# Patient Record
Sex: Female | Born: 1980 | Race: Black or African American | Hispanic: No | Marital: Single | Smoking: Never smoker
Health system: Southern US, Community
[De-identification: ages and names within clinical notes are randomized; demographics above are authoritative.]

## PROBLEM LIST (undated history)

## (undated) DIAGNOSIS — F32A Depression, unspecified: Secondary | ICD-10-CM

## (undated) DIAGNOSIS — F329 Major depressive disorder, single episode, unspecified: Secondary | ICD-10-CM

## (undated) DIAGNOSIS — F419 Anxiety disorder, unspecified: Secondary | ICD-10-CM

## (undated) DIAGNOSIS — I10 Essential (primary) hypertension: Secondary | ICD-10-CM

---

## 2008-12-04 ENCOUNTER — Emergency Department (HOSPITAL_COMMUNITY): Admission: EM | Admit: 2008-12-04 | Discharge: 2008-12-04 | Payer: Self-pay | Admitting: Emergency Medicine

## 2008-12-11 ENCOUNTER — Emergency Department (HOSPITAL_COMMUNITY): Admission: EM | Admit: 2008-12-11 | Discharge: 2008-12-11 | Payer: Self-pay | Admitting: Emergency Medicine

## 2008-12-29 ENCOUNTER — Emergency Department (HOSPITAL_COMMUNITY): Admission: EM | Admit: 2008-12-29 | Discharge: 2008-12-29 | Payer: Self-pay | Admitting: Emergency Medicine

## 2009-01-10 ENCOUNTER — Emergency Department (HOSPITAL_COMMUNITY): Admission: EM | Admit: 2009-01-10 | Discharge: 2009-01-10 | Payer: Self-pay | Admitting: Emergency Medicine

## 2009-05-08 ENCOUNTER — Emergency Department (HOSPITAL_COMMUNITY): Admission: EM | Admit: 2009-05-08 | Discharge: 2009-05-08 | Payer: Self-pay | Admitting: Emergency Medicine

## 2011-01-05 ENCOUNTER — Emergency Department (HOSPITAL_COMMUNITY): Payer: Self-pay

## 2011-01-05 ENCOUNTER — Emergency Department (HOSPITAL_COMMUNITY)
Admission: EM | Admit: 2011-01-05 | Discharge: 2011-01-05 | Disposition: A | Discharge status: Home / Self Care | Payer: Self-pay | Attending: Emergency Medicine | Admitting: Emergency Medicine

## 2011-01-05 DIAGNOSIS — R079 Chest pain, unspecified: Secondary | ICD-10-CM | POA: Insufficient documentation

## 2011-01-05 DIAGNOSIS — M542 Cervicalgia: Secondary | ICD-10-CM | POA: Insufficient documentation

## 2011-01-05 DIAGNOSIS — F410 Panic disorder [episodic paroxysmal anxiety] without agoraphobia: Secondary | ICD-10-CM | POA: Insufficient documentation

## 2011-01-05 DIAGNOSIS — I10 Essential (primary) hypertension: Secondary | ICD-10-CM | POA: Insufficient documentation

## 2011-01-05 DIAGNOSIS — S139XXA Sprain of joints and ligaments of unspecified parts of neck, initial encounter: Secondary | ICD-10-CM | POA: Insufficient documentation

## 2011-01-05 DIAGNOSIS — R071 Chest pain on breathing: Secondary | ICD-10-CM | POA: Insufficient documentation

## 2011-01-11 ENCOUNTER — Emergency Department (HOSPITAL_COMMUNITY): Payer: Self-pay

## 2011-01-11 ENCOUNTER — Emergency Department (HOSPITAL_COMMUNITY)
Admission: EM | Admit: 2011-01-11 | Discharge: 2011-01-12 | Disposition: A | Discharge status: Home / Self Care | Payer: Self-pay | Attending: Emergency Medicine | Admitting: Emergency Medicine

## 2011-01-11 DIAGNOSIS — Z79899 Other long term (current) drug therapy: Secondary | ICD-10-CM | POA: Insufficient documentation

## 2011-01-11 DIAGNOSIS — R109 Unspecified abdominal pain: Secondary | ICD-10-CM | POA: Insufficient documentation

## 2011-01-11 DIAGNOSIS — Y9229 Other specified public building as the place of occurrence of the external cause: Secondary | ICD-10-CM | POA: Insufficient documentation

## 2011-01-11 DIAGNOSIS — R51 Headache: Secondary | ICD-10-CM | POA: Insufficient documentation

## 2011-01-11 DIAGNOSIS — S0990XA Unspecified injury of head, initial encounter: Secondary | ICD-10-CM | POA: Insufficient documentation

## 2011-01-11 DIAGNOSIS — F329 Major depressive disorder, single episode, unspecified: Secondary | ICD-10-CM | POA: Insufficient documentation

## 2011-01-11 DIAGNOSIS — R112 Nausea with vomiting, unspecified: Secondary | ICD-10-CM | POA: Insufficient documentation

## 2011-01-11 DIAGNOSIS — R079 Chest pain, unspecified: Secondary | ICD-10-CM | POA: Insufficient documentation

## 2011-01-11 DIAGNOSIS — Y998 Other external cause status: Secondary | ICD-10-CM | POA: Insufficient documentation

## 2011-01-11 DIAGNOSIS — I10 Essential (primary) hypertension: Secondary | ICD-10-CM | POA: Insufficient documentation

## 2011-01-11 DIAGNOSIS — F3289 Other specified depressive episodes: Secondary | ICD-10-CM | POA: Insufficient documentation

## 2011-01-11 DIAGNOSIS — R0989 Other specified symptoms and signs involving the circulatory and respiratory systems: Secondary | ICD-10-CM | POA: Insufficient documentation

## 2011-01-11 DIAGNOSIS — R0609 Other forms of dyspnea: Secondary | ICD-10-CM | POA: Insufficient documentation

## 2011-01-11 DIAGNOSIS — R42 Dizziness and giddiness: Secondary | ICD-10-CM | POA: Insufficient documentation

## 2011-01-11 DIAGNOSIS — F411 Generalized anxiety disorder: Secondary | ICD-10-CM | POA: Insufficient documentation

## 2011-01-12 LAB — BASIC METABOLIC PANEL
CO2: 27 mEq/L (ref 19–32)
Chloride: 105 mEq/L (ref 96–112)
Creatinine, Ser: 0.7 mg/dL (ref 0.4–1.2)
GFR calc Af Amer: >60 mL/min (ref >60)
Potassium: 3.3 mEq/L — ABNORMAL LOW (ref 3.5–5.1)
Sodium: 137 mEq/L (ref 135–145)

## 2011-01-12 LAB — CBC
Hemoglobin: 11.6 g/dL — ABNORMAL LOW (ref 12.0–15.0)
Platelets: 343 10*3/uL (ref 150–400)
RBC: 3.85 MIL/uL — ABNORMAL LOW (ref 3.87–5.11)
WBC: 7.9 10*3/uL (ref 4.0–10.5)

## 2011-01-12 LAB — DIFFERENTIAL
Basophils Absolute: 0.1 10*3/uL (ref 0.0–0.1)
Basophils Relative: 1 % (ref 0–1)
Eosinophils Absolute: 0.9 10*3/uL — ABNORMAL HIGH (ref 0.0–0.7)
Monocytes Relative: 6 % (ref 3–12)
Neutro Abs: 4.3 10*3/uL (ref 1.7–7.7)
Neutrophils Relative %: 54 % (ref 43–77)

## 2011-01-12 MED ORDER — IOHEXOL 300 MG/ML  SOLN
120.0000 mL | Freq: Once | INTRAMUSCULAR | Status: AC | PRN
  Administered 2011-01-12: 120 mL via INTRAVENOUS

## 2011-04-16 ENCOUNTER — Encounter: Payer: Self-pay | Admitting: Emergency Medicine

## 2011-04-16 ENCOUNTER — Emergency Department (HOSPITAL_COMMUNITY): Payer: Self-pay

## 2011-04-16 ENCOUNTER — Other Ambulatory Visit: Payer: Self-pay

## 2011-04-16 ENCOUNTER — Emergency Department (HOSPITAL_COMMUNITY)
Admission: EM | Admit: 2011-04-16 | Discharge: 2011-04-16 | Disposition: A | Discharge status: Home / Self Care | Payer: Self-pay | Attending: Emergency Medicine | Admitting: Emergency Medicine

## 2011-04-16 DIAGNOSIS — R42 Dizziness and giddiness: Secondary | ICD-10-CM | POA: Insufficient documentation

## 2011-04-16 DIAGNOSIS — R079 Chest pain, unspecified: Secondary | ICD-10-CM | POA: Insufficient documentation

## 2011-04-16 DIAGNOSIS — R0602 Shortness of breath: Secondary | ICD-10-CM | POA: Insufficient documentation

## 2011-04-16 DIAGNOSIS — N390 Urinary tract infection, site not specified: Secondary | ICD-10-CM | POA: Insufficient documentation

## 2011-04-16 DIAGNOSIS — R112 Nausea with vomiting, unspecified: Secondary | ICD-10-CM | POA: Insufficient documentation

## 2011-04-16 DIAGNOSIS — F411 Generalized anxiety disorder: Secondary | ICD-10-CM | POA: Insufficient documentation

## 2011-04-16 DIAGNOSIS — R109 Unspecified abdominal pain: Secondary | ICD-10-CM | POA: Insufficient documentation

## 2011-04-16 HISTORY — DX: Essential (primary) hypertension: I10

## 2011-04-16 LAB — DIFFERENTIAL
Eosinophils Absolute: 0.2 10*3/uL (ref 0.0–0.7)
Eosinophils Relative: 2 % (ref 0–5)
Lymphocytes Relative: 25 % (ref 12–46)
Lymphs Abs: 2.3 10*3/uL (ref 0.7–4.0)
Monocytes Relative: 6 % (ref 3–12)

## 2011-04-16 LAB — CBC
Hemoglobin: 13 g/dL (ref 12.0–15.0)
MCH: 30.1 pg (ref 26.0–34.0)
MCV: 91.7 fL (ref 78.0–100.0)
Platelets: 394 10*3/uL (ref 150–400)
RBC: 4.32 MIL/uL (ref 3.87–5.11)
WBC: 9 10*3/uL (ref 4.0–10.5)

## 2011-04-16 LAB — URINE MICROSCOPIC-ADD ON

## 2011-04-16 LAB — BASIC METABOLIC PANEL
BUN: 10 mg/dL (ref 6–23)
CO2: 27 mEq/L (ref 19–32)
Calcium: 9.6 mg/dL (ref 8.4–10.5)
Glucose, Bld: 86 mg/dL (ref 70–99)
Potassium: 3.4 mEq/L — ABNORMAL LOW (ref 3.5–5.1)
Sodium: 138 mEq/L (ref 135–145)

## 2011-04-16 LAB — URINALYSIS, ROUTINE W REFLEX MICROSCOPIC
Bilirubin Urine: NEGATIVE
Hgb urine dipstick: NEGATIVE
Ketones, ur: NEGATIVE mg/dL
Specific Gravity, Urine: >1.03 — ABNORMAL HIGH (ref 1.005–1.030)
Urobilinogen, UA: 0.2 mg/dL (ref 0.0–1.0)
pH: 6 (ref 5.0–8.0)

## 2011-04-16 MED ORDER — HYDROCODONE-ACETAMINOPHEN 5-325 MG PO TABS: 1.0000 | ORAL_TABLET | ORAL | Status: AC | PRN

## 2011-04-16 MED ORDER — CEPHALEXIN 500 MG PO CAPS: 500.0000 mg | ORAL_CAPSULE | Freq: Four times a day (QID) | ORAL | Status: AC

## 2011-04-16 MED ORDER — HYDROMORPHONE HCL 1 MG/ML IJ SOLN
1.0000 mg | Freq: Once | INTRAMUSCULAR | Status: AC
  Administered 2011-04-16: 1 mg via INTRAVENOUS
  Filled 2011-04-16: qty 1

## 2011-04-16 MED ORDER — IOHEXOL 300 MG/ML  SOLN
100.0000 mL | Freq: Once | INTRAMUSCULAR | Status: AC | PRN
  Administered 2011-04-16: 100 mL via INTRAVENOUS

## 2011-04-16 MED ORDER — SODIUM CHLORIDE 0.9 % IV SOLN
INTRAVENOUS | Status: DC
  Administered 2011-04-16: 15:00:00 via INTRAVENOUS

## 2011-04-16 MED ORDER — CEFTRIAXONE SODIUM 1 G IJ SOLR: 1.0000 g | Freq: Once | INTRAMUSCULAR | Status: DC

## 2011-04-16 MED ORDER — HYDROCODONE-ACETAMINOPHEN 5-325 MG PO TABS
1.0000 | ORAL_TABLET | Freq: Once | ORAL | Status: AC
  Administered 2011-04-16: 1 via ORAL
  Filled 2011-04-16: qty 1

## 2011-04-16 MED ORDER — CEFTRIAXONE SODIUM 1 G IJ SOLR
1.0000 g | Freq: Once | INTRAMUSCULAR | Status: AC
  Administered 2011-04-16: 1 g via INTRAVENOUS
  Filled 2011-04-16: qty 1

## 2011-04-16 MED ORDER — ONDANSETRON 4 MG PO TBDP
4.0000 mg | ORAL_TABLET | Freq: Once | ORAL | Status: AC
  Administered 2011-04-16: 4 mg via ORAL
  Filled 2011-04-16: qty 1

## 2011-04-16 NOTE — ED Notes (Signed)
Pt waiting to be reeval and disposition 

## 2011-04-16 NOTE — ED Provider Notes (Signed)
History    Scribed for Cassandra Jakes, MD, the patient was seen in room APA19/[facility]19. This chart was scribed by Clarita Crane. This patient's care was started at 1:49 PM.  Chief Complaint  Patient presents with  . Abdominal Pain  . Chest Pain  . Nausea  . Emesis   HPI Patient is a 30 year old female c/o waxing and waning right lower quadrant abdominal pain described as "painful" onset 1.5 weeks ago but worse today with associated nausea, vomiting, lightheadedness. Rates current abdominal pain an 8/10. States vomiting began yesterday morning and reports 2 episodes of vomiting today. Additionally, patient reports having intermittent  chest pains with SOB onset 1.5 weeks ago and persistent since. States chest pain will resolve on its own and notes most episodes occur when she becomes anxious. Denies diarrhea, fever, dysuria, rash, back pain.  LNMP- 3 days ago.   PAST MEDICAL HISTORY:  Past Medical History  Diagnosis Date  . Hypertension     PAST SURGICAL HISTORY:  History reviewed. No pertinent past surgical history.  MEDICATIONS:  Previous Medications   ACETAMINOPHEN (TYLENOL) 500 MG TABLET    Take 1,000 mg by mouth every 6 (six) hours as needed. For pain    FLUOXETINE (PROZAC) 20 MG TABLET    Take 20 mg by mouth daily.     GABAPENTIN (NEURONTIN) 300 MG CAPSULE    Take 300 mg by mouth daily.    IBUPROFEN (ADVIL,MOTRIN) 800 MG TABLET    Take 800 mg by mouth every 8 (eight) hours as needed. For migraine    MULTIVITAMIN (THERAGRAN) PER TABLET    Take 1 tablet by mouth daily.     NORGESTIMATE-ETHINYL ESTRADIOL (ORTHO-CYCLEN,SPRINTEC,PREVIFEM) 0.25-35 MG-MCG TABLET    Take 1 tablet by mouth daily.     SPIRONOLACTONE (ALDACTONE) 25 MG TABLET    Take 25 mg by mouth daily.       ALLERGIES:  Allergies as of 04/16/2011 - Review Complete 04/16/2011  Allergen Reaction Noted  . Promethazine  04/16/2011  . Reglan  04/16/2011     FAMILY HISTORY:  No family history on file.   SOCIAL  HISTORY: History   Social History  . Marital Status: Single    Spouse Name: N/A    Number of Children: N/A  . Years of Education: N/A   Social History Main Topics  . Smoking status: Never Smoker   . Smokeless tobacco: None  . Alcohol Use: No  . Drug Use: No  . Sexually Active: Yes    Birth Control/ Protection: Pill   Other Topics Concern  . None   Social History Narrative  . None      OB History    Grav Para Term Preterm Abortions TAB SAB Ect Mult Living   0               Review of Systems  Constitutional: Negative for fever and chills.  HENT: Negative for rhinorrhea.   Eyes: Negative for pain.  Respiratory: Positive for shortness of breath. Negative for cough.   Cardiovascular: Positive for chest pain.  Gastrointestinal: Positive for nausea, vomiting and abdominal pain. Negative for diarrhea.  Genitourinary: Negative for dysuria.  Musculoskeletal: Negative for back pain.  Skin: Negative for rash.  Neurological: Positive for light-headedness. Negative for weakness and headaches.  Psychiatric/Behavioral: The patient is nervous/anxious.     Physical Exam  BP 127/65  Pulse 74  Temp(Src) 98.9 F (37.2 C) (Oral)  Resp 18  Ht 5\' 2"  (1.575 m)  Wt 226 lb (102.513 kg)  BMI 41.34 kg/m2  SpO2 100%  LMP 04/11/2011  Physical Exam  Nursing note and vitals reviewed. Constitutional: She is oriented to person, place, and time. Vital signs are normal. She appears well-developed and well-nourished. No distress.       Obese  HENT:  Head: Normocephalic and atraumatic.  Eyes: Conjunctivae are normal. Pupils are equal, round, and reactive to light.  Neck: Neck supple.  Cardiovascular: Normal rate and regular rhythm.  Exam reveals no gallop and no friction rub.   No murmur heard. Pulmonary/Chest: Effort normal and breath sounds normal. She has no wheezes.  Abdominal: Soft. Bowel sounds are normal. She exhibits no distension. There is no tenderness.  Musculoskeletal:  Normal range of motion. She exhibits no edema.  Neurological: She is alert and oriented to person, place, and time. No sensory deficit.  Skin: Skin is warm and dry.  Psychiatric: She has a normal mood and affect. Her behavior is normal.    ED Course  Procedures  OTHER DATA REVIEWED: Nursing notes, vital signs, and past medical records reviewed.   DIAGNOSTIC STUDIES:    LABS / RADIOLOGY: Results for orders placed during the hospital encounter of 04/16/11  CBC      Component Value Range   WBC 9.0  4.0 - 10.5 (K/uL)   RBC 4.32  3.87 - 5.11 (MIL/uL)   Hemoglobin 13.0  12.0 - 15.0 (g/dL)   HCT 16.1  09.6 - 04.5 (%)   MCV 91.7  78.0 - 100.0 (fL)   MCH 30.1  26.0 - 34.0 (pg)   MCHC 32.8  30.0 - 36.0 (g/dL)   RDW 40.9  81.1 - 91.4 (%)   Platelets 394  150 - 400 (K/uL)  DIFFERENTIAL      Component Value Range   Neutrophils Relative 67  43 - 77 (%)   Neutro Abs 6.0  1.7 - 7.7 (K/uL)   Lymphocytes Relative 25  12 - 46 (%)   Lymphs Abs 2.3  0.7 - 4.0 (K/uL)   Monocytes Relative 6  3 - 12 (%)   Monocytes Absolute 0.5  0.1 - 1.0 (K/uL)   Eosinophils Relative 2  0 - 5 (%)   Eosinophils Absolute 0.2  0.0 - 0.7 (K/uL)   Basophils Relative 1  0 - 1 (%)   Basophils Absolute 0.1  0.0 - 0.1 (K/uL)  BASIC METABOLIC PANEL      Component Value Range   Sodium 138  135 - 145 (mEq/L)   Potassium 3.4 (*) 3.5 - 5.1 (mEq/L)   Chloride 102  96 - 112 (mEq/L)   CO2 27  19 - 32 (mEq/L)   Glucose, Bld 86  70 - 99 (mg/dL)   BUN 10  6 - 23 (mg/dL)   Creatinine, Ser 7.82  0.50 - 1.10 (mg/dL)   Calcium 9.6  8.4 - 95.6 (mg/dL)   GFR calc non Af Amer >60  >60 (mL/min)   GFR calc Af Amer >60  >60 (mL/min)  PREGNANCY, URINE      Component Value Range   Preg Test, Ur NEGATIVE    URINALYSIS, ROUTINE W REFLEX MICROSCOPIC      Component Value Range   Color, Urine YELLOW  YELLOW    Appearance CLEAR  CLEAR    Specific Gravity, Urine >1.030 (*) 1.005 - 1.030    pH 6.0  5.0 - 8.0    Glucose, UA NEGATIVE   NEGATIVE (mg/dL)   Hgb urine dipstick NEGATIVE  NEGATIVE    Bilirubin Urine NEGATIVE  NEGATIVE    Ketones, ur NEGATIVE  NEGATIVE (mg/dL)   Protein, ur NEGATIVE  NEGATIVE (mg/dL)   Urobilinogen, UA 0.2  0.0 - 1.0 (mg/dL)   Nitrite NEGATIVE  NEGATIVE    Leukocytes, UA TRACE (*) NEGATIVE   URINE MICROSCOPIC-ADD ON      Component Value Range   Squamous Epithelial / LPF MANY (*) RARE    WBC, UA 11-20  <3 (WBC/hpf)   RBC / HPF 0-2  <3 (RBC/hpf)   Bacteria, UA MANY (*) RARE    Crystals CA OXALATE CRYSTALS (*) NEGATIVE      PROCEDURES:  ED COURSE / COORDINATION OF CARE:  Date: 04/16/2011  Rate: 73  Rhythm: normal sinus rhythm  QRS Axis: normal  Intervals: QT prolonged  ST/T Wave abnormalities: normal  Conduction Disutrbances:none  Narrative Interpretation:   Old EKG Reviewed: none available    MDM: Differential Diagnosis:  CT OF ABD WITHOUT SPECIFIC FINDINGS. BUT UA ABNORMAL PERHAPS SYMPTOMS RELATED TO UTI BUT PAIN MOSTLY RLQ. LABS OTHERWISE NORMAL.   I personally performed the services described in this documentation, which was scribed in my presence. The recorded information has been reviewed and considered. Cassandra Jakes, MD  IMPRESSION: Diagnoses that have been ruled out:  Diagnoses that are still under consideration:  Final diagnoses:     PLAN: DISCHARGE The patient is to return the emergency department if there is any worsening of symptoms. I have reviewed the discharge instructions with the patient/family   CONDITION ON DISCHARGE: STABLE   MEDICATIONS GIVEN IN THE E.D.  Medications  gabapentin (NEURONTIN) 300 MG capsule (not administered)  FLUoxetine (PROZAC) 20 MG tablet (not administered)  norgestimate-ethinyl estradiol (ORTHO-CYCLEN,SPRINTEC,PREVIFEM) 0.25-35 MG-MCG tablet (not administered)  multivitamin (THERAGRAN) per tablet (not administered)  spironolactone (ALDACTONE) 25 MG tablet (not administered)  acetaminophen (TYLENOL) 500 MG tablet  (not administered)  ibuprofen (ADVIL,MOTRIN) 800 MG tablet (not administered)     DISCHARGE MEDICATIONS: New Prescriptions   No medications on file       Cassandra Jakes, MD 04/16/11 [phone]

## 2011-04-16 NOTE — ED Notes (Signed)
Pt c/o intermittent abd pain/n/v/chest pressure x 1.5 weeks with some lightheadedness/dissiness/sob at times. Pt c/o "feeling anxious" with these symptoms. nad noted.

## 2011-04-16 NOTE — ED Notes (Signed)
Pt req more pain meds. States her stomach has started hurting again. edp aware

## 2011-04-21 ENCOUNTER — Emergency Department (HOSPITAL_COMMUNITY)
Admission: EM | Admit: 2011-04-21 | Discharge: 2011-04-22 | Disposition: A | Discharge status: Home / Self Care | Payer: Self-pay | Attending: Emergency Medicine | Admitting: Emergency Medicine

## 2011-04-21 ENCOUNTER — Encounter (HOSPITAL_COMMUNITY): Payer: Self-pay | Admitting: *Deleted

## 2011-04-21 DIAGNOSIS — N730 Acute parametritis and pelvic cellulitis: Secondary | ICD-10-CM | POA: Insufficient documentation

## 2011-04-21 DIAGNOSIS — N760 Acute vaginitis: Secondary | ICD-10-CM | POA: Insufficient documentation

## 2011-04-21 DIAGNOSIS — I10 Essential (primary) hypertension: Secondary | ICD-10-CM | POA: Insufficient documentation

## 2011-04-21 DIAGNOSIS — B9689 Other specified bacterial agents as the cause of diseases classified elsewhere: Secondary | ICD-10-CM | POA: Insufficient documentation

## 2011-04-21 DIAGNOSIS — N390 Urinary tract infection, site not specified: Secondary | ICD-10-CM | POA: Insufficient documentation

## 2011-04-21 DIAGNOSIS — A499 Bacterial infection, unspecified: Secondary | ICD-10-CM | POA: Insufficient documentation

## 2011-04-21 LAB — CBC
Platelets: 353 10*3/uL (ref 150–400)
RBC: 4.38 MIL/uL (ref 3.87–5.11)
RDW: 13.3 % (ref 11.5–15.5)
WBC: 8.6 10*3/uL (ref 4.0–10.5)

## 2011-04-21 LAB — URINALYSIS, ROUTINE W REFLEX MICROSCOPIC
Bilirubin Urine: NEGATIVE
Glucose, UA: NEGATIVE mg/dL
Hgb urine dipstick: NEGATIVE
Ketones, ur: NEGATIVE mg/dL
Protein, ur: NEGATIVE mg/dL

## 2011-04-21 LAB — COMPREHENSIVE METABOLIC PANEL
ALT: 15 U/L (ref 0–35)
AST: 18 U/L (ref 0–37)
Alkaline Phosphatase: 53 U/L (ref 39–117)
CO2: 25 mEq/L (ref 19–32)
Chloride: 99 mEq/L (ref 96–112)
GFR calc non Af Amer: >60 mL/min (ref >60)
Potassium: 3.8 mEq/L (ref 3.5–5.1)
Sodium: 137 mEq/L (ref 135–145)
Total Bilirubin: 0.3 mg/dL (ref 0.3–1.2)

## 2011-04-21 LAB — URINE MICROSCOPIC-ADD ON

## 2011-04-21 LAB — DIFFERENTIAL
Basophils Absolute: 0.1 10*3/uL (ref 0.0–0.1)
Lymphocytes Relative: 30 % (ref 12–46)
Lymphs Abs: 2.6 10*3/uL (ref 0.7–4.0)
Neutro Abs: 4.6 10*3/uL (ref 1.7–7.7)

## 2011-04-21 LAB — WET PREP, GENITAL
Trich, Wet Prep: NONE SEEN
Yeast Wet Prep HPF POC: NONE SEEN

## 2011-04-21 MED ORDER — DOXYCYCLINE HYCLATE 100 MG PO TABS
100.0000 mg | ORAL_TABLET | Freq: Once | ORAL | Status: AC
  Administered 2011-04-21: 100 mg via ORAL
  Filled 2011-04-21: qty 1

## 2011-04-21 MED ORDER — METRONIDAZOLE 500 MG PO TABS: 500.0000 mg | ORAL_TABLET | Freq: Three times a day (TID) | ORAL | Status: DC

## 2011-04-21 MED ORDER — OXYCODONE-ACETAMINOPHEN 5-325 MG PO TABS: 1.0000 | ORAL_TABLET | ORAL | Status: AC | PRN

## 2011-04-21 MED ORDER — SODIUM CHLORIDE 0.9 % IV SOLN
999.0000 mL | INTRAVENOUS | Status: DC
  Administered 2011-04-21: 999 mL via INTRAVENOUS

## 2011-04-21 MED ORDER — DOXYCYCLINE HYCLATE 100 MG PO CAPS: 100.0000 mg | ORAL_CAPSULE | Freq: Two times a day (BID) | ORAL | Status: AC

## 2011-04-21 MED ORDER — OXYCODONE-ACETAMINOPHEN 5-325 MG PO TABS
1.0000 | ORAL_TABLET | Freq: Once | ORAL | Status: AC
  Administered 2011-04-21: 1 via ORAL
  Filled 2011-04-21: qty 1

## 2011-04-21 MED ORDER — METRONIDAZOLE 500 MG PO TABS
500.0000 mg | ORAL_TABLET | Freq: Once | ORAL | Status: AC
  Administered 2011-04-21: 500 mg via ORAL
  Filled 2011-04-21: qty 1

## 2011-04-21 MED ORDER — METRONIDAZOLE 500 MG PO TABS: 500.0000 mg | ORAL_TABLET | Freq: Two times a day (BID) | ORAL | Status: AC

## 2011-04-21 MED ORDER — ONDANSETRON HCL 4 MG/2ML IJ SOLN
4.0000 mg | Freq: Once | INTRAMUSCULAR | Status: AC
  Administered 2011-04-21: 4 mg via INTRAVENOUS
  Filled 2011-04-21: qty 2

## 2011-04-21 MED ORDER — CEFTRIAXONE SODIUM 250 MG IJ SOLR
250.0000 mg | INTRAMUSCULAR | Status: DC
  Administered 2011-04-21: 250 mg via INTRAMUSCULAR
  Filled 2011-04-21: qty 250

## 2011-04-21 MED ORDER — HYDROMORPHONE HCL 1 MG/ML IJ SOLN
1.0000 mg | Freq: Once | INTRAMUSCULAR | Status: AC
  Administered 2011-04-21: 1 mg via INTRAVENOUS
  Filled 2011-04-21: qty 1

## 2011-04-21 NOTE — ED Provider Notes (Signed)
History     CSN: [id number] Arrival date & time: 04/21/2011  8:06 PM  Chief Complaint  Patient presents with  . Abdominal Pain   Patient is a 30 y.o. female presenting with abdominal pain. The history is provided by the patient.  Abdominal Pain The primary symptoms of the illness include abdominal pain, nausea and vaginal bleeding. The primary symptoms of the illness do not include vomiting, diarrhea or vaginal discharge. Episode onset: She was seen here six days ago, diagnosed wtih a UTI and sent home on Keflex and Norco which have not helped. The onset of the illness was gradual. The problem has been gradually worsening (Pain is 8/10 currently, but has been as bad as 10/10).  The patient has not had a change in bowel habit. Additional symptoms associated with the illness include urgency and frequency. Symptoms associated with the illness do not include anorexia or diaphoresis. Associated symptoms comments: Nothing makes her pain better, nothing makes it worse. She denies vaginal discharge..    Past Medical History  Diagnosis Date  . Hypertension     History reviewed. No pertinent past surgical history.  History reviewed. No pertinent family history.  History  Substance Use Topics  . Smoking status: Never Smoker   . Smokeless tobacco: Not on file  . Alcohol Use: No    OB History    Grav Para Term Preterm Abortions TAB SAB Ect Mult Living   0               Review of Systems  Constitutional: Negative for diaphoresis.  Gastrointestinal: Positive for nausea and abdominal pain. Negative for vomiting, diarrhea and anorexia.  Genitourinary: Positive for urgency, frequency and vaginal bleeding. Negative for vaginal discharge.  All other systems reviewed and are negative.    Physical Exam  BP 134/77  Pulse 98  Temp(Src) 98.8 F (37.1 C) (Oral)  Resp 18  Ht 5\' 2"  (1.575 m)  Wt 227 lb (102.967 kg)  BMI 41.52 kg/m2  SpO2 100%  LMP 04/11/2011  Physical Exam  Nursing note  and vitals reviewed. Constitutional: She is oriented to person, place, and time. She appears well-developed and well-nourished. No distress.  HENT:  Head: Normocephalic and atraumatic.  Right Ear: External ear normal.  Left Ear: External ear normal.  Nose: Nose normal.  Mouth/Throat: Oropharynx is clear and moist.  Eyes: Conjunctivae are normal. Pupils are equal, round, and reactive to light. No scleral icterus.  Neck: Normal range of motion. Neck supple. No JVD present.  Cardiovascular: Normal rate, regular rhythm and normal heart sounds.   No murmur heard. Pulmonary/Chest: Effort normal and breath sounds normal. She has no wheezes. She has no rales.  Abdominal: Soft. Bowel sounds are normal. There is tenderness in the right lower quadrant and suprapubic area. There is CVA tenderness. There is no rebound and no guarding.  Genitourinary:       Normal external genitalia. Moderate white vaginal discharge. Marked tenderness diffusely, with positive cervical motion tenderness.  Musculoskeletal: Normal range of motion. She exhibits edema.       Arms: Lymphadenopathy:    She has no cervical adenopathy.  Neurological: She is alert and oriented to person, place, and time. She has normal reflexes. Coordination normal.  Skin: Skin is warm and dry. No rash noted.  Psychiatric: She has a normal mood and affect.    ED Course  Procedures  MDM Persistent lower abdominal pain. CT on last visit was negative. Will need to repeat lab workup and  also do pelvic exam.  Pelvic exam most consistent with PID - will treat accordingly. Wet prep shows clue cells - will also treat for bacterial vaginosis.  Labs reviewed.  Results for orders placed during the hospital encounter of 04/21/11  CBC      Component Value Range   WBC 8.6  4.0 - 10.5 (K/uL)   RBC 4.38  3.87 - 5.11 (MIL/uL)   Hemoglobin 13.1  12.0 - 15.0 (g/dL)   HCT 16.1  09.6 - 04.5 (%)   MCV 91.1  78.0 - 100.0 (fL)   MCH 29.9  26.0 - 34.0 (pg)    MCHC 32.8  30.0 - 36.0 (g/dL)   RDW 40.9  81.1 - 91.4 (%)   Platelets 353  150 - 400 (K/uL)  DIFFERENTIAL      Component Value Range   Neutrophils Relative 54  43 - 77 (%)   Neutro Abs 4.6  1.7 - 7.7 (K/uL)   Lymphocytes Relative 30  12 - 46 (%)   Lymphs Abs 2.6  0.7 - 4.0 (K/uL)   Monocytes Relative 7  3 - 12 (%)   Monocytes Absolute 0.6  0.1 - 1.0 (K/uL)   Eosinophils Relative 9 (*) 0 - 5 (%)   Eosinophils Absolute 0.8 (*) 0.0 - 0.7 (K/uL)   Basophils Relative 1  0 - 1 (%)   Basophils Absolute 0.1  0.0 - 0.1 (K/uL)  COMPREHENSIVE METABOLIC PANEL      Component Value Range   Sodium 137  135 - 145 (mEq/L)   Potassium 3.8  3.5 - 5.1 (mEq/L)   Chloride 99  96 - 112 (mEq/L)   CO2 25  19 - 32 (mEq/L)   Glucose, Bld 97  70 - 99 (mg/dL)   BUN 7  6 - 23 (mg/dL)   Creatinine, Ser 7.82  0.50 - 1.10 (mg/dL)   Calcium 9.4  8.4 - 95.6 (mg/dL)   Total Protein 7.6  6.0 - 8.3 (g/dL)   Albumin 3.7  3.5 - 5.2 (g/dL)   AST 18  0 - 37 (U/L)   ALT 15  0 - 35 (U/L)   Alkaline Phosphatase 53  39 - 117 (U/L)   Total Bilirubin 0.3  0.3 - 1.2 (mg/dL)   GFR calc non Af Amer >60  >60 (mL/min)   GFR calc Af Amer >60  >60 (mL/min)  LIPASE, BLOOD      Component Value Range   Lipase 18  11 - 59 (U/L)  URINALYSIS, ROUTINE W REFLEX MICROSCOPIC      Component Value Range   Color, Urine YELLOW  YELLOW    Appearance CLEAR  CLEAR    Specific Gravity, Urine 1.015  1.005 - 1.030    pH 7.0  5.0 - 8.0    Glucose, UA NEGATIVE  NEGATIVE (mg/dL)   Hgb urine dipstick NEGATIVE  NEGATIVE    Bilirubin Urine NEGATIVE  NEGATIVE    Ketones, ur NEGATIVE  NEGATIVE (mg/dL)   Protein, ur NEGATIVE  NEGATIVE (mg/dL)   Urobilinogen, UA 0.2  0.0 - 1.0 (mg/dL)   Nitrite NEGATIVE  NEGATIVE    Leukocytes, UA TRACE (*) NEGATIVE   WET PREP, GENITAL      Component Value Range   Yeast, Wet Prep NONE SEEN  NONE SEEN    Trich, Wet Prep NONE SEEN  NONE SEEN    Clue Cells, Wet Prep MANY (*) NONE SEEN    WBC, Wet Prep HPF POC  MANY (*) NONE SEEN  URINE MICROSCOPIC-ADD ON      Component Value Range   Squamous Epithelial / LPF FEW (*) RARE    WBC, UA 21-50  <3 (WBC/hpf)   Bacteria, UA MANY (*) RARE   POCT PREGNANCY, URINE      Component Value Range   Preg Test, Ur NEGATIVE     Ct Abdomen Pelvis W Contrast  04/16/2011  *RADIOLOGY REPORT*  Clinical Data: Chest abdominal pain with nausea and emesis for 10 days, worse today.  CT ABDOMEN AND PELVIS WITH CONTRAST  Technique:  Multidetector CT imaging of the abdomen and pelvis was performed following the standard protocol during bolus administration of intravenous contrast.  Contrast: 100 ml Omnipaque-300 intravenously.  Comparison: Prior examination 01/12/2011.  Findings: The lung bases are clear.  There is no pleural effusion. A small hiatal hernia is again noted.  The bowel gas pattern is normal.  The appendix appears normal.  There are no inflammatory changes.  A small right adrenal nodule appears unchanged.  The left adrenal gland appears normal.  The liver, spleen, gallbladder, pancreas and biliary system appear normal.  The kidneys appear normal.  The uterus, ovaries and urinary bladder appear normal.  There are no acute osseous findings.  IMPRESSION: Stable examination - no acute abdominal pelvic findings demonstrated.  Stable small right adrenal adenoma and hiatal hernia.  Original Report Authenticated By: Gerrianne Scale, M.D.      Dione Booze, MD 04/21/11 [phone]

## 2011-04-21 NOTE — ED Notes (Signed)
Pt c/o abdominal pain x 2 weeks. Pt was seen in ED last Monday and told she had a UTI. Pt states that it has gotten worse. Pt c/o urinary frequency and vomiting also. States that she finishes the antibiotics tonight.

## 2011-04-23 LAB — GC/CHLAMYDIA PROBE AMP, GENITAL: GC Probe Amp, Genital: NEGATIVE

## 2011-04-24 ENCOUNTER — Encounter (HOSPITAL_COMMUNITY): Payer: Self-pay | Admitting: *Deleted

## 2011-04-24 ENCOUNTER — Emergency Department (HOSPITAL_COMMUNITY): Payer: Self-pay

## 2011-04-24 ENCOUNTER — Emergency Department (HOSPITAL_COMMUNITY)
Admission: EM | Admit: 2011-04-24 | Discharge: 2011-04-24 | Disposition: A | Discharge status: Home / Self Care | Payer: Self-pay | Attending: Emergency Medicine | Admitting: Emergency Medicine

## 2011-04-24 DIAGNOSIS — I10 Essential (primary) hypertension: Secondary | ICD-10-CM | POA: Insufficient documentation

## 2011-04-24 DIAGNOSIS — R509 Fever, unspecified: Secondary | ICD-10-CM | POA: Insufficient documentation

## 2011-04-24 DIAGNOSIS — R1084 Generalized abdominal pain: Secondary | ICD-10-CM

## 2011-04-24 DIAGNOSIS — R112 Nausea with vomiting, unspecified: Secondary | ICD-10-CM | POA: Insufficient documentation

## 2011-04-24 DIAGNOSIS — R109 Unspecified abdominal pain: Secondary | ICD-10-CM | POA: Insufficient documentation

## 2011-04-24 LAB — URINE CULTURE: Culture: NO GROWTH

## 2011-04-24 MED ORDER — ONDANSETRON 8 MG PO TBDP
8.0000 mg | ORAL_TABLET | Freq: Once | ORAL | Status: AC
  Administered 2011-04-24: 8 mg via ORAL
  Filled 2011-04-24: qty 1

## 2011-04-24 MED ORDER — OXYCODONE-ACETAMINOPHEN 5-325 MG PO TABS
2.0000 | ORAL_TABLET | Freq: Once | ORAL | Status: AC
  Administered 2011-04-24: 2 via ORAL
  Filled 2011-04-24 (×2): qty 1

## 2011-04-24 MED ORDER — ONDANSETRON HCL 4 MG PO TABS: 8.0000 mg | ORAL_TABLET | Freq: Four times a day (QID) | ORAL | Status: AC

## 2011-04-24 NOTE — ED Notes (Signed)
Pt transported to [facility] at this time. 

## 2011-04-24 NOTE — ED Notes (Signed)
Pt c/o sharp pain to vaginal area

## 2011-04-24 NOTE — ED Provider Notes (Signed)
History     CSN: [id number] Arrival date & time: 04/24/2011  6:45 AM  Chief Complaint  Patient presents with  . Abdominal Pain   Patient is a 30 y.o. female presenting with abdominal pain.  Abdominal Pain The primary symptoms of the illness include abdominal pain, fever, nausea and vomiting. The primary symptoms of the illness do not include diarrhea, dysuria, vaginal discharge or vaginal bleeding. The current episode started less than 1 hour ago. The onset of the illness was sudden. The problem has been gradually worsening.  The illness is associated with eating (movement). The patient states that she believes she is currently not pregnant.   Cassandra Rosario is a 30 y.o. female who presents to the Emergency Department complaining of abdominal pain.  Pt reports that she has had lower abd pain for past several weeks.  This morning, sat down on a bench and had worsening of the lower abd pain and radiated into her pelvic region.  No vag bleeding, no trauma.    PAST MEDICAL HISTORY:  Past Medical History  Diagnosis Date  . Hypertension      PAST SURGICAL HISTORY:  History reviewed. No pertinent past surgical history.   MEDICATIONS:  Previous Medications   ACETAMINOPHEN (TYLENOL) 500 MG TABLET    Take 1,000 mg by mouth every 6 (six) hours as needed. For pain    CEPHALEXIN (KEFLEX) 500 MG CAPSULE    Take 1 capsule (500 mg total) by mouth 4 (four) times daily.   DOXYCYCLINE (VIBRAMYCIN) 100 MG CAPSULE    Take 1 capsule (100 mg total) by mouth 2 (two) times daily.   FLUOXETINE (PROZAC) 20 MG TABLET    Take 20 mg by mouth daily.     GABAPENTIN (NEURONTIN) 300 MG CAPSULE    Take 300 mg by mouth daily.    HYDROCODONE-ACETAMINOPHEN (NORCO) 5-325 MG PER TABLET    Take 1-2 tablets by mouth every 4 (four) hours as needed for pain.   IBUPROFEN (ADVIL,MOTRIN) 800 MG TABLET    Take 800 mg by mouth every 8 (eight) hours as needed. For migraine    METRONIDAZOLE (FLAGYL) 500 MG TABLET    Take 1 tablet  (500 mg total) by mouth 2 (two) times daily.   MULTIVITAMIN (THERAGRAN) PER TABLET    Take 1 tablet by mouth daily.     NORGESTIMATE-ETHINYL ESTRADIOL (ORTHO-CYCLEN,SPRINTEC,PREVIFEM) 0.25-35 MG-MCG TABLET    Take 1 tablet by mouth daily.     OXYCODONE-ACETAMINOPHEN (PERCOCET) 5-325 MG PER TABLET    Take 1 tablet by mouth every 4 (four) hours as needed for pain.   SPIRONOLACTONE (ALDACTONE) 25 MG TABLET    Take 25 mg by mouth daily.       ALLERGIES:  Allergies as of 04/24/2011 - Review Complete 04/24/2011  Allergen Reaction Noted  . Promethazine  04/16/2011  . Reglan  04/16/2011     FAMILY HISTORY:  History reviewed. No pertinent family history.   SOCIAL HISTORY: History   Social History  . Marital Status: Single    Spouse Name: N/A    Number of Children: N/A  . Years of Education: N/A   Social History Main Topics  . Smoking status: Never Smoker   . Smokeless tobacco: None  . Alcohol Use: No  . Drug Use: No  . Sexually Active: Yes    Birth Control/ Protection: Pill   Other Topics Concern  . None   Social History Narrative  . None     Review of Systems  Constitutional: Positive for fever.  Gastrointestinal: Positive for nausea, vomiting and abdominal pain. Negative for diarrhea.  Genitourinary: Negative for dysuria, vaginal bleeding and vaginal discharge.  All other systems reviewed and are negative.     Physical Exam  BP 140/100  Pulse 82  Temp(Src) 98.3 F (36.8 C) (Oral)  Resp 20  Ht 5\' 2"  (1.575 m)  Wt 227 lb (102.967 kg)  BMI 41.52 kg/m2  SpO2 99%  LMP 04/11/2011  Physical Exam  CONSTITUTIONAL: Well developed/well nourished HEAD AND FACE: Normocephalic/atraumatic EYES: EOMI/PERRL ENMT: Mucous membranes moist NECK: supple no meningeal signs SPINE:entire spine nontender CV: S1/S2 noted, no murmurs/rubs/gallops noted LUNGS: Lungs are clear to auscultation bilaterally, no apparent distress ABDOMEN: soft,suprapubic tenderness, no rebound or  guarding EA:VWUJWJ external genitalia, no bruising, no vag bleeding noted ( chaperone present) NEURO: Pt is awake/alert, moves all extremitiesx4 EXTREMITIES: pulses normal, full ROM SKIN: warm, color normal PSYCH: no abnormalities of mood noted  ED Course  Procedures  OTHER DATA REVIEWED: Nursing notes, vital signs, and past medical records reviewed.   Pt with recent workup, including negative CT abd.   Given acute worsening of lower pelvic pain, will perform [place] to r/o torsion/TOA    [place] negative Pt has had extensive ED workup recently without cause I have referred her to local gynecologist She is advised to continue her meds as prescribed    IMPRESSION Abdominal pain, generalized  Hypertension     PLAN:  discharge The patient is to return the emergency department if there is any worsening of symptoms. I have reviewed the discharge instructions with the patient   CONDITION ON DISCHARGE: stable   MEDICATIONS GIVEN IN THE E.D.  Medications  oxyCODONE-acetaminophen (PERCOCET) 5-325 MG per tablet 2 tablet (2 tablet Oral Given 04/24/11 0734)  ondansetron (ZOFRAN-ODT) disintegrating tablet 8 mg (8 mg Oral Given 04/24/11 0735)     DISCHARGE MEDICATIONS: New Prescriptions   No medications on file        This patient seen by myself only, no scribe involved  Joya Gaskins, MD 04/24/11 [phone]

## 2011-04-24 NOTE — ED Notes (Signed)
Pt back from [place]. States pain is better. Pt slightly groggy. Nad.

## 2012-02-19 ENCOUNTER — Emergency Department (HOSPITAL_COMMUNITY)
Admission: EM | Admit: 2012-02-19 | Discharge: 2012-02-19 | Disposition: A | Discharge status: Home / Self Care | Payer: Self-pay | Attending: Emergency Medicine | Admitting: Emergency Medicine

## 2012-02-19 ENCOUNTER — Encounter (HOSPITAL_COMMUNITY): Payer: Self-pay | Admitting: *Deleted

## 2012-02-19 ENCOUNTER — Emergency Department (HOSPITAL_COMMUNITY): Payer: Self-pay

## 2012-02-19 ENCOUNTER — Other Ambulatory Visit: Payer: Self-pay

## 2012-02-19 DIAGNOSIS — F3289 Other specified depressive episodes: Secondary | ICD-10-CM | POA: Insufficient documentation

## 2012-02-19 DIAGNOSIS — I10 Essential (primary) hypertension: Secondary | ICD-10-CM | POA: Insufficient documentation

## 2012-02-19 DIAGNOSIS — F329 Major depressive disorder, single episode, unspecified: Secondary | ICD-10-CM | POA: Insufficient documentation

## 2012-02-19 DIAGNOSIS — R079 Chest pain, unspecified: Secondary | ICD-10-CM | POA: Insufficient documentation

## 2012-02-19 DIAGNOSIS — F411 Generalized anxiety disorder: Secondary | ICD-10-CM | POA: Insufficient documentation

## 2012-02-19 DIAGNOSIS — M940 Chondrocostal junction syndrome [Tietze]: Secondary | ICD-10-CM

## 2012-02-19 HISTORY — DX: Depression, unspecified: F32.A

## 2012-02-19 HISTORY — DX: Anxiety disorder, unspecified: F41.9

## 2012-02-19 HISTORY — DX: Major depressive disorder, single episode, unspecified: F32.9

## 2012-02-19 LAB — POCT I-STAT TROPONIN I: Troponin i, poc: 0 ng/mL (ref 0.00–0.08)

## 2012-02-19 LAB — POCT I-STAT, CHEM 8
BUN: 5 mg/dL — ABNORMAL LOW (ref 6–23)
Chloride: 103 mEq/L (ref 96–112)
Potassium: 4 mEq/L (ref 3.5–5.1)
Sodium: 141 mEq/L (ref 135–145)

## 2012-02-19 LAB — CBC
Platelets: 350 10*3/uL (ref 150–400)
RBC: 4.36 MIL/uL (ref 3.87–5.11)
WBC: 8 10*3/uL (ref 4.0–10.5)

## 2012-02-19 MED ORDER — KETOROLAC TROMETHAMINE 30 MG/ML IJ SOLN
30.0000 mg | Freq: Once | INTRAMUSCULAR | Status: AC
  Administered 2012-02-19: 30 mg via INTRAVENOUS
  Filled 2012-02-19: qty 1

## 2012-02-19 MED ORDER — TRAMADOL HCL 50 MG PO TABS: 50.0000 mg | ORAL_TABLET | Freq: Four times a day (QID) | ORAL | Status: AC | PRN

## 2012-02-19 NOTE — ED Notes (Signed)
Pt reports left sided chest pain, intermittent, since this am radiating down left arm associated with sob and nausea. Reports has not ate anything in 3 days.

## 2012-02-19 NOTE — ED Notes (Signed)
Pt was received to [facility] with complaint of lt sided chest pain radiating to the lt arm  with dizziness. Pt stated that she fell backwards and hit her head. Pt is attached to the monitor and was changed to a hospital gown.

## 2012-02-19 NOTE — ED Provider Notes (Addendum)
History     CSN: [id number]  Arrival date & time 02/19/12  1012   First MD Initiated Contact with Patient 02/19/12 1212      Chief Complaint  Patient presents with  . Chest Pain    (Consider location/radiation/quality/duration/timing/severity/associated sxs/prior treatment) Patient is a 31 y.o. female presenting with chest pain. The history is provided by the patient.  Chest Pain Pertinent negatives for primary symptoms include no fever, no shortness of breath, no cough, no palpitations, no nausea and no vomiting.    the patient is a 31 year old, female, with a history of hypertension, anxiety, and depression, who presents emergency department complaining of intermittent left-sided chest pain.  She describes the chest.  Pain is sharp.  It radiates into her left arm.  It is not associated with nausea, vomiting, shortness of breath, cough, leg pain or swelling.  She says it can last a few minutes at a time.  She denies pain at this time.  She said she had an episode that was so severe this morning that she almost fainted.  She does not smoke and she does not take birth control pills.  She states that she was seen for similar symptoms in [facility] emergency department they did not give her diagnosis for her symptoms.    Past Medical History  Diagnosis Date  . Hypertension   . Anxiety   . Depression     History reviewed. No pertinent past surgical history.  History reviewed. No pertinent family history.  History  Substance Use Topics  . Smoking status: Never Smoker   . Smokeless tobacco: Not on file  . Alcohol Use: No    OB History    Grav Para Term Preterm Abortions TAB SAB Ect Mult Living   0               Review of Systems  Constitutional: Negative for fever and chills.  Respiratory: Negative for cough, chest tightness and shortness of breath.   Cardiovascular: Positive for chest pain. Negative for palpitations and leg swelling.  Gastrointestinal: Negative for nausea and  vomiting.  Skin: Negative for rash.  Neurological: Negative for headaches.  Psychiatric/Behavioral: Negative for confusion.  All other systems reviewed and are negative.    Allergies  Metoclopramide hcl and Promethazine  Home Medications   Current Outpatient Rx  Name Route Sig Dispense Refill  . LISINOPRIL-HYDROCHLOROTHIAZIDE 20-12.5 MG PO TABS Oral Take 1 tablet by mouth daily.    . ADULT MULTIVITAMIN W/MINERALS CH Oral Take 1 tablet by mouth daily.      BP 131/90  Pulse 69  Temp(Src) 98.4 F (36.9 C) (Oral)  Resp 16  SpO2 100%  LMP 11/21/2011  Physical Exam  Nursing note and vitals reviewed. Constitutional: She is oriented to person, place, and time. She appears well-developed and well-nourished. No distress.  HENT:  Head: Normocephalic and atraumatic.  Eyes: Conjunctivae and EOM are normal.  Neck: Normal range of motion. Neck supple.  Cardiovascular: Normal rate.   No murmur heard. Pulmonary/Chest: Effort normal and breath sounds normal. No respiratory distress. She exhibits tenderness.       Left parasternal chest wall tenderness  Abdominal: Soft. Bowel sounds are normal. She exhibits no distension. There is no tenderness.  Musculoskeletal: Normal range of motion. She exhibits no edema and no tenderness.  Neurological: She is alert and oriented to person, place, and time.  Skin: Skin is warm and dry.  Psychiatric: Thought content normal.    ED Course  Procedures (including  critical care time) Intermittent Sharp Left sided cp with radiation to left arm in young healthy female with no risk factors for acs or pe.    Labs Reviewed  POCT I-STAT, CHEM 8 - Abnormal; Notable for the following:    BUN 5 (*)    All other components within normal limits  CBC  POCT I-STAT TROPONIN I   Dg Chest 2 View  02/19/2012  *RADIOLOGY REPORT*  Clinical Data: Chest pain, shortness of breath, dizziness  CHEST - 2 VIEW  Comparison: 01/12/2011; 01/05/2011  Findings: Normal cardiac  silhouette and mediastinal contours.  No focal parenchymal opacities.  No pleural effusion or pneumothorax. Unchanged bones.  IMPRESSION: No acute cardiopulmonary disease.  Original Report Authenticated By: Waynard Reeds, M.D.     No diagnosis found.     Rate: 72  Rhythm: normal sinus rhythm  QRS Axis: normal  Intervals: normal  ST/T Wave abnormalities: normal  Conduction Disutrbances: none  Narrative Interpretation: unremarkable     MDM  Left chest pain No evidence of acs. No risk factors for pe. No pneumonia or ptx.         Cheri Guppy, MD 02/19/12 1247  Cheri Guppy, MD 05/20/12 [phone]

## 2012-02-19 NOTE — Discharge Instructions (Signed)
Your EKG, blood test, and chest x-ray, do not show any significant illness.  Use ibuprofen 600 mg every 6 hours for pain.  Use tramadol for more severe pain.  Followup with your Dr. if your symptoms persist

## 2012-10-09 IMAGING — CT CT ABD-PELV W/ CM
2 of 4 series · 16 of 46 positions shown, 18 images · IV contrast (Omnipaque 300)
Comparison: Prior examination 01/12/2011.

CLINICAL DATA: Chest abdominal pain with nausea and emesis for 10
days, worse today.

CT ABDOMEN AND PELVIS WITH CONTRAST
TECHNIQUE: Multidetector CT imaging of the abdomen and pelvis was
performed following the standard protocol during bolus
administration of intravenous contrast.
Contrast: 100 ml Qmnipaque-D44 intravenously.

[Series 2: abd_pel_with 5.0 b40f · axial · 0.74mm/px · z∈[-452,-12]mm · 13 of 96 slices shown, 15 images]
[im 4/96  soft-tissue]
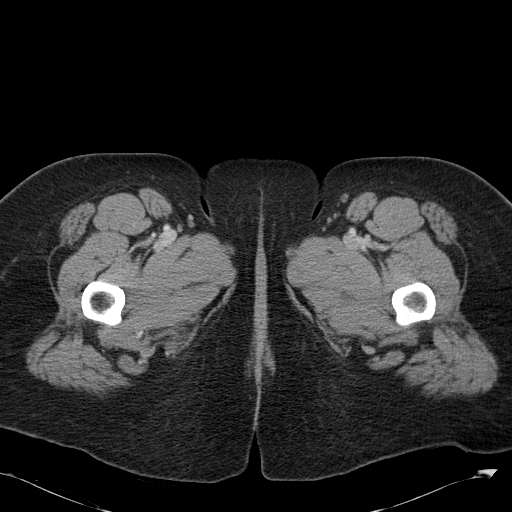
[im 4/96  bone]
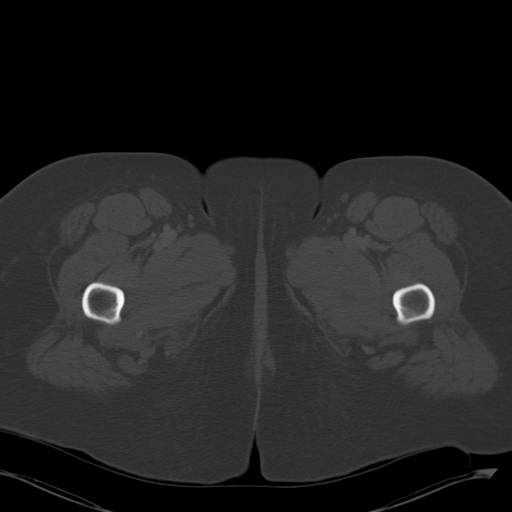
[im 12/96  soft-tissue]
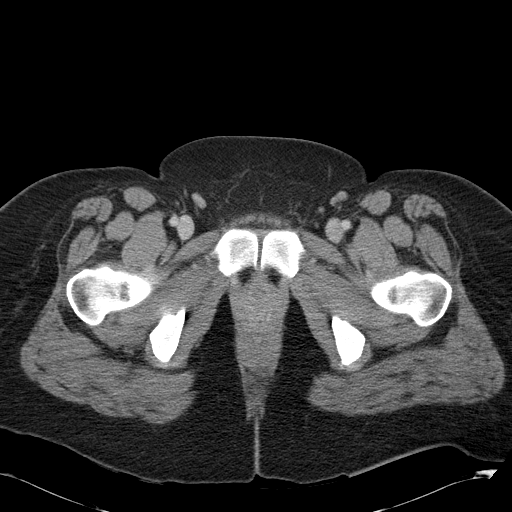
[im 20/96  soft-tissue]
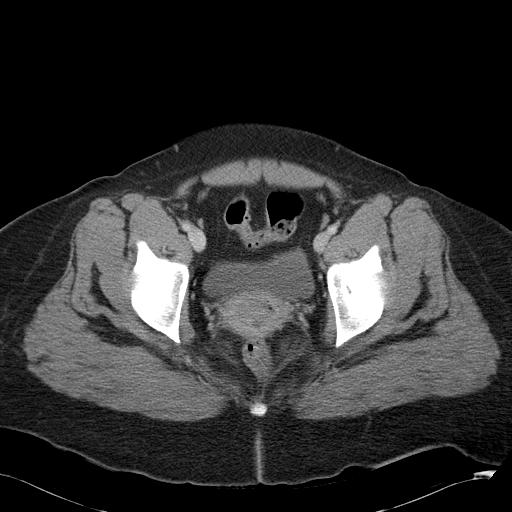
[im 28/96  soft-tissue]
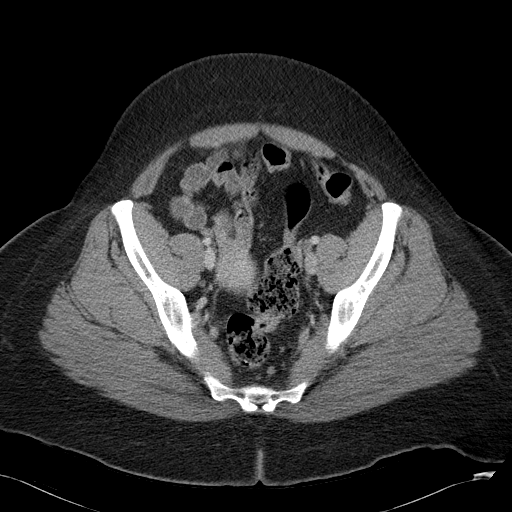
[im 32/96  soft-tissue]
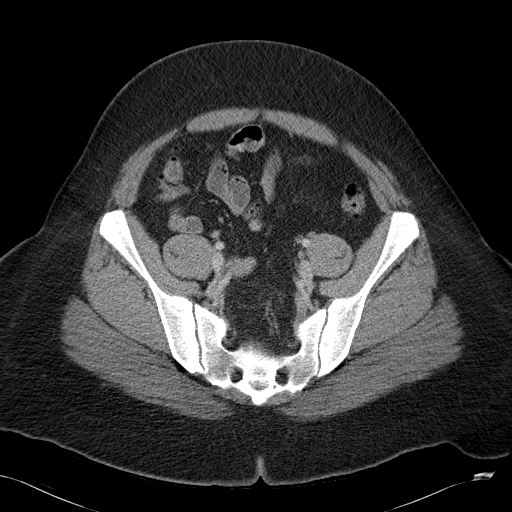
[im 40/96  soft-tissue]
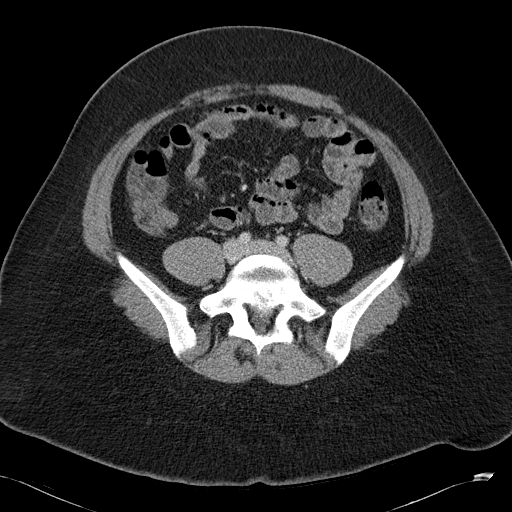
[im 48/96  soft-tissue]
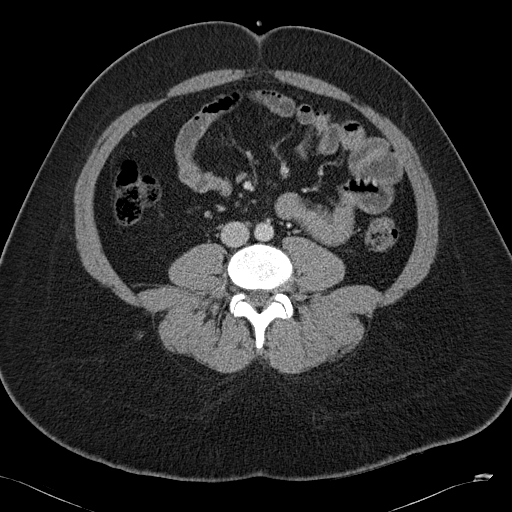
[im 56/96  soft-tissue]
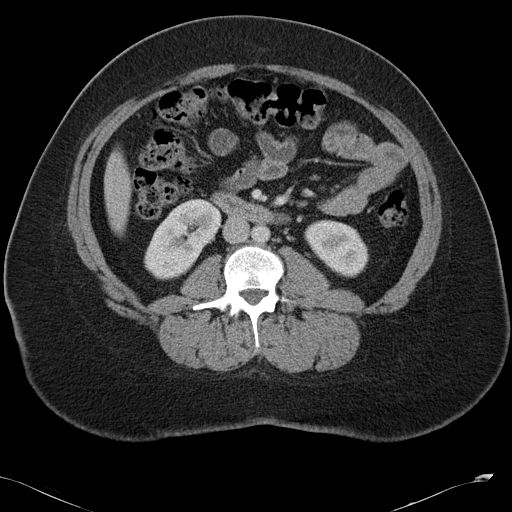
[im 64/96  soft-tissue]
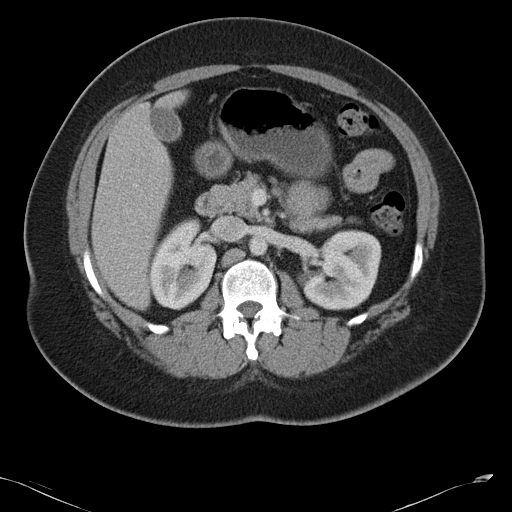
[im 64/96  bone]
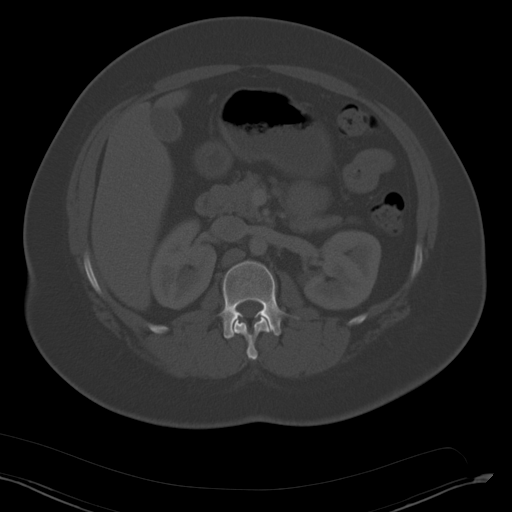
[im 68/96  soft-tissue]
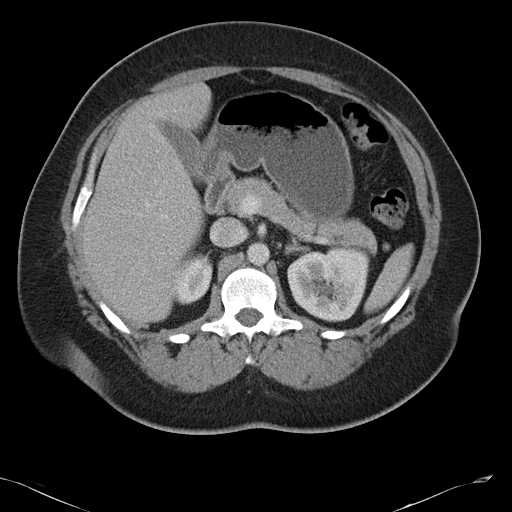
[im 76/96  soft-tissue]
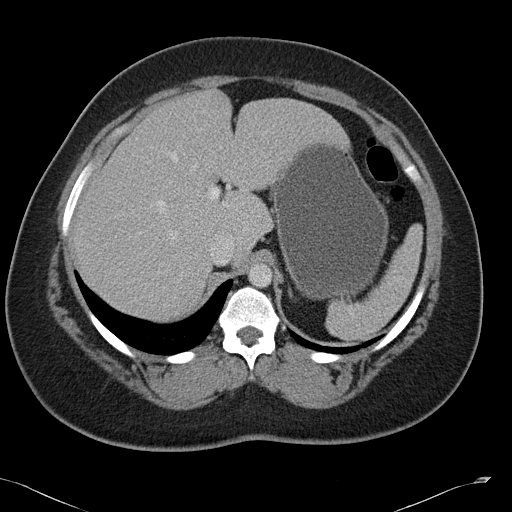
[im 84/96  soft-tissue]
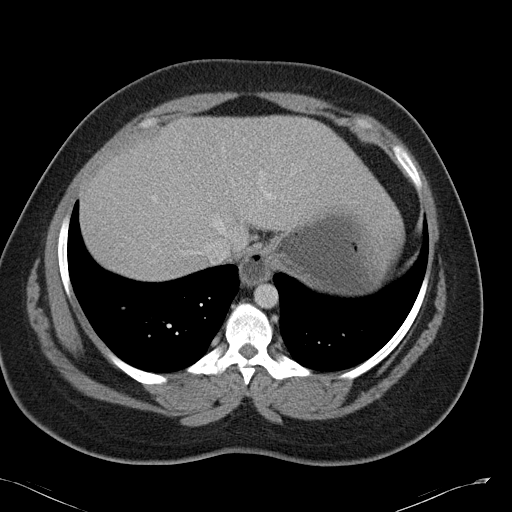
[im 92/96  soft-tissue]
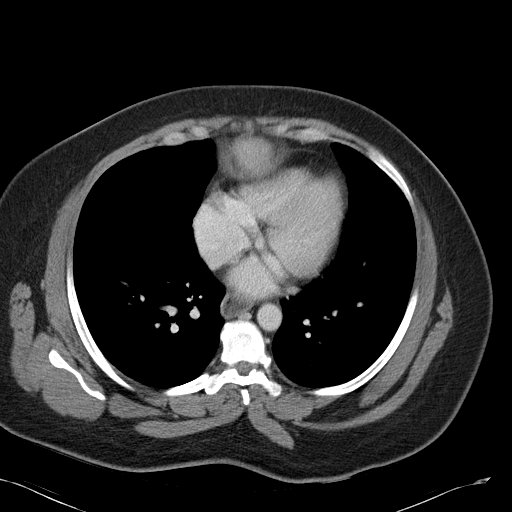

[Series 4: abd_pel_with 3.0 spo cor · coronal · 0.71mm/px · 3 of 107 slices shown]
[im 36/107  soft-tissue]
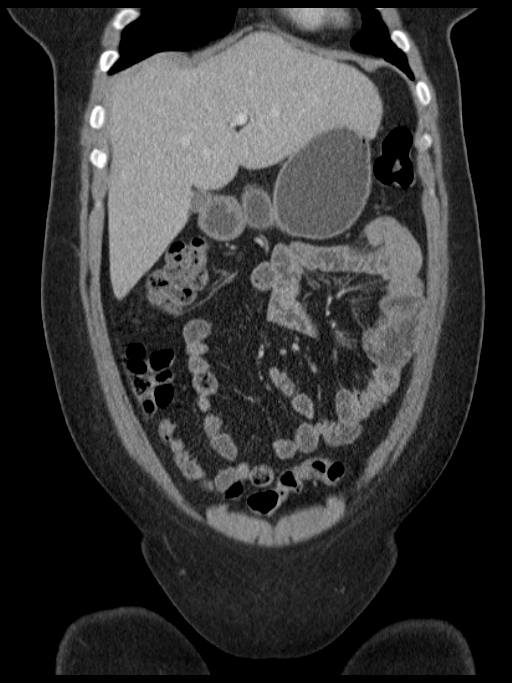
[im 48/107  soft-tissue]
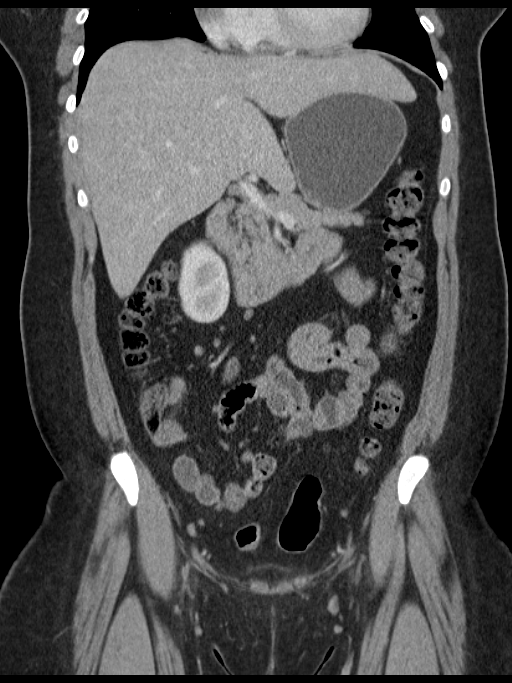
[im 59/107  soft-tissue]
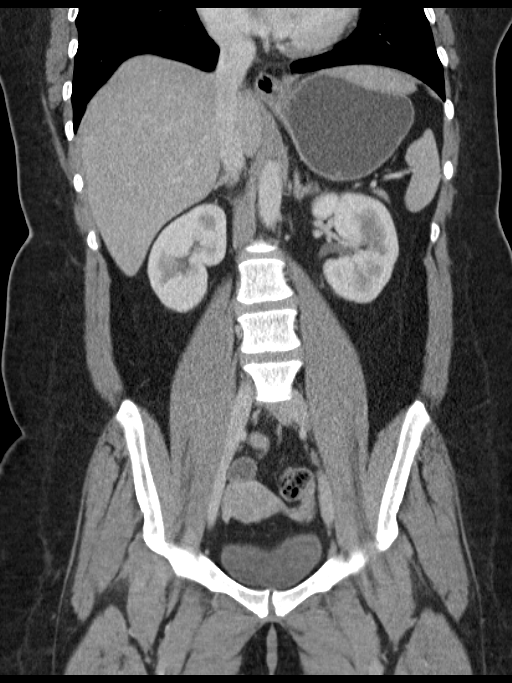

[16 of 46 positions shown; findings below may reference images not displayed]

FINDINGS: The lung bases are clear.  There is no pleural effusion.
A small hiatal hernia is again noted.  The bowel gas pattern is
normal.  The appendix appears normal.  There are no inflammatory
changes.

A small right adrenal nodule appears unchanged.  The left adrenal
gland appears normal.  The liver, spleen, gallbladder, pancreas and
biliary system appear normal.  The kidneys appear normal.

The uterus, ovaries and urinary bladder appear normal.  There are
no acute osseous findings.
IMPRESSION: Stable examination - no acute abdominal pelvic findings
demonstrated.  Stable small right adrenal adenoma and hiatal
hernia.

## 2013-12-05 ENCOUNTER — Encounter (HOSPITAL_COMMUNITY): Payer: Self-pay | Admitting: Emergency Medicine

## 2013-12-05 ENCOUNTER — Emergency Department (HOSPITAL_COMMUNITY): Payer: Self-pay

## 2013-12-05 ENCOUNTER — Emergency Department (HOSPITAL_COMMUNITY)
Admission: EM | Admit: 2013-12-05 | Discharge: 2013-12-05 | Disposition: A | Discharge status: Home / Self Care | Payer: Self-pay | Attending: Emergency Medicine | Admitting: Emergency Medicine

## 2013-12-05 DIAGNOSIS — I10 Essential (primary) hypertension: Secondary | ICD-10-CM | POA: Insufficient documentation

## 2013-12-05 DIAGNOSIS — F411 Generalized anxiety disorder: Secondary | ICD-10-CM | POA: Insufficient documentation

## 2013-12-05 DIAGNOSIS — R112 Nausea with vomiting, unspecified: Secondary | ICD-10-CM | POA: Insufficient documentation

## 2013-12-05 DIAGNOSIS — Z79899 Other long term (current) drug therapy: Secondary | ICD-10-CM | POA: Insufficient documentation

## 2013-12-05 DIAGNOSIS — R079 Chest pain, unspecified: Secondary | ICD-10-CM | POA: Insufficient documentation

## 2013-12-05 LAB — TROPONIN I: Troponin I: <0.3 ng/mL (ref <0.30)

## 2013-12-05 LAB — CBC
HEMATOCRIT: 36.9 % (ref 36.0–46.0)
Hemoglobin: 12.3 g/dL (ref 12.0–15.0)
MCH: 30.2 pg (ref 26.0–34.0)
MCHC: 33.3 g/dL (ref 30.0–36.0)
MCV: 90.7 fL (ref 78.0–100.0)
PLATELETS: 317 10*3/uL (ref 150–400)
RBC: 4.07 MIL/uL (ref 3.87–5.11)
RDW: 13.6 % (ref 11.5–15.5)
WBC: 8.8 10*3/uL (ref 4.0–10.5)

## 2013-12-05 LAB — COMPREHENSIVE METABOLIC PANEL
ALK PHOS: 48 U/L (ref 39–117)
ALT: 19 U/L (ref 0–35)
AST: 24 U/L (ref 0–37)
Albumin: 3.9 g/dL (ref 3.5–5.2)
BUN: 11 mg/dL (ref 6–23)
CHLORIDE: 103 meq/L (ref 96–112)
CO2: 27 mEq/L (ref 19–32)
CREATININE: 0.68 mg/dL (ref 0.50–1.10)
Calcium: 8.9 mg/dL (ref 8.4–10.5)
GLUCOSE: 84 mg/dL (ref 70–99)
POTASSIUM: 3.9 meq/L (ref 3.7–5.3)
Sodium: 141 mEq/L (ref 137–147)
Total Bilirubin: 0.5 mg/dL (ref 0.3–1.2)
Total Protein: 6.4 g/dL (ref 6.0–8.3)

## 2013-12-05 LAB — LIPASE, BLOOD: LIPASE: 16 U/L (ref 11–59)

## 2013-12-05 MED ORDER — PANTOPRAZOLE SODIUM 40 MG PO TBEC: 40.0000 mg | DELAYED_RELEASE_TABLET | Freq: Every day | ORAL | Status: AC

## 2013-12-05 MED ORDER — GI COCKTAIL ~~LOC~~
30.0000 mL | Freq: Once | ORAL | Status: AC
  Administered 2013-12-05: 30 mL via ORAL
  Filled 2013-12-05: qty 30

## 2013-12-05 MED ORDER — DIPHENHYDRAMINE HCL 25 MG PO CAPS
25.0000 mg | ORAL_CAPSULE | Freq: Once | ORAL | Status: AC
  Administered 2013-12-05: 25 mg via ORAL
  Filled 2013-12-05: qty 1

## 2013-12-05 MED ORDER — TRAMADOL HCL 50 MG PO TABS: 50.0000 mg | ORAL_TABLET | Freq: Four times a day (QID) | ORAL | Status: AC | PRN

## 2013-12-05 MED ORDER — OXYCODONE-ACETAMINOPHEN 5-325 MG PO TABS
1.0000 | ORAL_TABLET | Freq: Once | ORAL | Status: AC
  Administered 2013-12-05: 1 via ORAL
  Filled 2013-12-05: qty 1

## 2013-12-05 MED ORDER — LORAZEPAM 2 MG/ML IJ SOLN
1.0000 mg | Freq: Once | INTRAMUSCULAR | Status: AC
  Administered 2013-12-05: 1 mg via INTRAVENOUS
  Filled 2013-12-05: qty 1

## 2013-12-05 MED ORDER — FAMOTIDINE 20 MG PO TABS
20.0000 mg | ORAL_TABLET | Freq: Once | ORAL | Status: AC
  Administered 2013-12-05: 20 mg via ORAL
  Filled 2013-12-05: qty 1

## 2013-12-05 MED ORDER — HYDROCODONE-ACETAMINOPHEN 5-325 MG PO TABS
1.0000 | ORAL_TABLET | Freq: Once | ORAL | Status: AC
  Administered 2013-12-05: 1 via ORAL
  Filled 2013-12-05: qty 1

## 2013-12-05 MED ORDER — DIPHENHYDRAMINE HCL 50 MG/ML IJ SOLN
25.0000 mg | Freq: Once | INTRAMUSCULAR | Status: AC
  Administered 2013-12-05: 25 mg via INTRAVENOUS
  Filled 2013-12-05: qty 1

## 2013-12-05 MED ORDER — IBUPROFEN 400 MG PO TABS
400.0000 mg | ORAL_TABLET | Freq: Once | ORAL | Status: AC
  Administered 2013-12-05: 400 mg via ORAL
  Filled 2013-12-05: qty 1

## 2013-12-05 NOTE — ED Notes (Signed)
Patient transported to X-ray 

## 2013-12-05 NOTE — ED Provider Notes (Signed)
CSN: [id number]     Arrival date & time 12/05/13  1602 History   First MD Initiated Contact with Patient 12/05/13 1627     Chief Complaint  Patient presents with  . Chest Pain  . Emesis     (Consider location/radiation/quality/duration/timing/severity/associated sxs/prior Treatment) Patient is a 33 y.o. female presenting with chest pain and vomiting. The history is provided by the patient and the spouse.  Chest Pain Associated symptoms: nausea and vomiting   Associated symptoms: no abdominal pain, no back pain, no fever, no headache and no shortness of breath   Emesis Associated symptoms: no abdominal pain, no chills, no diarrhea, no headaches and no sore throat   pt w hx gerd, htn, anxiety, c/o mid chest pain in past day, constant, burning, dull.  At rest. No change w activity or exertion. Nausea. No diaphoresis or sob. No change whether upright or supine. Not pleuritic. ?acid/sour/burning in throat. Denies cough or uri c/o. No fever or chills. Denies abd pain. No hx gallstones. No hx cad, or fam hx premature cad. States grandparents, others in family w heart disease in older age. Spouse states pt has had these symptoms for long while, and has been urging to get checked out.  Prior symptoms also at rest. No exertional cp, no unusual doe or fatigue. No relation to eating. Pt denies immobility, trauma, recent travel or surgery. No pleuritic pain.     Past Medical History  Diagnosis Date  . Hypertension   . Anxiety   . Depression    History reviewed. No pertinent past surgical history. No family history on file. History  Substance Use Topics  . Smoking status: Never Smoker   . Smokeless tobacco: Not on file  . Alcohol Use: No   OB History   Grav Para Term Preterm Abortions TAB SAB Ect Mult Living   0              Review of Systems  Constitutional: Negative for fever and chills.  HENT: Negative for sore throat.   Eyes: Negative for redness.  Respiratory: Negative for  shortness of breath.   Cardiovascular: Positive for chest pain.  Gastrointestinal: Positive for nausea and vomiting. Negative for abdominal pain, diarrhea, constipation and abdominal distention.  Genitourinary: Negative for dysuria and flank pain.  Musculoskeletal: Negative for back pain and neck pain.  Skin: Negative for rash.  Neurological: Negative for headaches.  Hematological: Does not bruise/bleed easily.  Psychiatric/Behavioral: Negative for confusion.      Allergies  Metoclopramide hcl and Promethazine  Home Medications   Current Outpatient Rx  Name  Route  Sig  Dispense  Refill  . busPIRone (BUSPAR) 10 MG tablet   Oral   Take 10 mg by mouth 3 (three) times daily.         Marland Kitchen. ibuprofen (ADVIL,MOTRIN) 200 MG tablet   Oral   Take 600 mg by mouth every 6 (six) hours as needed for moderate pain.         Marland Kitchen. lisinopril-hydrochlorothiazide (PRINZIDE,ZESTORETIC) 20-12.5 MG per tablet   Oral   Take 1 tablet by mouth daily.         . Multiple Vitamin (MULITIVITAMIN WITH MINERALS) TABS   Oral   Take 1 tablet by mouth daily.          BP 144/66  Pulse 78  Temp(Src) 98.6 F (37 C) (Oral)  Resp 20  Ht 5\' 2"  (1.575 m)  Wt 225 lb (102.059 kg)  BMI 41.14 kg/m2  SpO2  100%  LMP 11/08/2013 Physical Exam  Nursing note and vitals reviewed. Constitutional: She appears well-developed and well-nourished. No distress.  HENT:  Mouth/Throat: Oropharynx is clear and moist.  Eyes: Conjunctivae are normal. No scleral icterus.  Neck: Neck supple. No tracheal deviation present.  Cardiovascular: Normal rate, regular rhythm, normal heart sounds and intact distal pulses.  Exam reveals no gallop and no friction rub.   No murmur heard. Pulmonary/Chest: Effort normal. No respiratory distress. She exhibits tenderness.  Abdominal: Soft. Normal appearance and bowel sounds are normal. She exhibits no distension and no mass. There is no tenderness. There is no rebound and no guarding.   Musculoskeletal: She exhibits no edema and no tenderness.  Neurological: She is alert.  Skin: Skin is warm and dry. No rash noted.  Psychiatric: She has a normal mood and affect.    ED Course  Procedures (including critical care time)    Results for orders placed during the hospital encounter of 12/05/13  CBC      Result Value Ref Range   WBC 8.8  4.0 - 10.5 K/uL   RBC 4.07  3.87 - 5.11 MIL/uL   Hemoglobin 12.3  12.0 - 15.0 g/dL   HCT 29.5  62.1 - 30.8 %   MCV 90.7  78.0 - 100.0 fL   MCH 30.2  26.0 - 34.0 pg   MCHC 33.3  30.0 - 36.0 g/dL   RDW 65.7  84.6 - 96.2 %   Platelets 317  150 - 400 K/uL  COMPREHENSIVE METABOLIC PANEL      Result Value Ref Range   Sodium 141  137 - 147 mEq/L   Potassium 3.9  3.7 - 5.3 mEq/L   Chloride 103  96 - 112 mEq/L   CO2 27  19 - 32 mEq/L   Glucose, Bld 84  70 - 99 mg/dL   BUN 11  6 - 23 mg/dL   Creatinine, Ser 9.52  0.50 - 1.10 mg/dL   Calcium 8.9  8.4 - 84.1 mg/dL   Total Protein 6.4  6.0 - 8.3 g/dL   Albumin 3.9  3.5 - 5.2 g/dL   AST 24  0 - 37 U/L   ALT 19  0 - 35 U/L   Alkaline Phosphatase 48  39 - 117 U/L   Total Bilirubin 0.5  0.3 - 1.2 mg/dL   GFR calc non Af Amer >90  >90 mL/min   GFR calc Af Amer >90  >90 mL/min  LIPASE, BLOOD      Result Value Ref Range   Lipase 16  11 - 59 U/L  TROPONIN I      Result Value Ref Range   Troponin I <0.30  <0.30 ng/mL  TROPONIN I      Result Value Ref Range   Troponin I <0.30  <0.30 ng/mL   Dg Chest 2 View (if Patient Has Fever And/or Copd)  12/05/2013   CLINICAL DATA:  Mid chest pain  EXAM: CHEST  2 VIEW  COMPARISON:  02/19/2012  FINDINGS: Lungs are clear. No pleural effusion or pneumothorax.  The heart is normal size.  Mild degenerative changes of the visualized thoracolumbar spine.  IMPRESSION: No evidence of acute cardiopulmonary disease.   Electronically Signed   By: Charline Bills M.D.   On: 12/05/2013 18:35      Date: 12/05/2013  Rate: 78  Rhythm: normal sinus rhythm  QRS  Axis: normal  Intervals: normal  ST/T Wave abnormalities: normal  Conduction Disutrbances:none  Narrative Interpretation:  Old EKG Reviewed: unchanged    MDM  Iv ns. Labs. Cxr. Ecg.  Pt c/o burning sensation in throat, hx gerd. pepcid and gi cocktail for symptom relief.  Reviewed nursing notes and prior charts for additional history.   Pt requests benadryl re itching.  No rash/hives. No wheezing. No throat swelling. No apparent adverse rxn to meds. Benadryl po.  Recheck pt requests pain med. Percocet po.  Chest cta. abd soft nt. Breathing comfortably.  Pt appears anxious/stressed.  Offered reassurance that exam, vitals, initial labs look good. Anxious, hx same. Ativan 1 mg po.  Recheck pt calm and alert. After symptoms for past day, initial and repeat trop at 4 hrs normal. Pt w atypical pain felt not c/w acs.  Pt comfortable on recheck and appears stable for d/c.  Discussed close pcp follow up.      Suzi Roots, MD 12/05/13 2119

## 2013-12-05 NOTE — ED Notes (Signed)
[place] chest pain that radiates to right arm and back onset about 1 hour ago while walking. Pt reports pain increases with movement and inspiration. Pt also reports nausea and vomiting. Pt given 4 mg of Zofran by EMS.

## 2013-12-05 NOTE — ED Notes (Signed)
Pt ambulatory and VS stable. Pt is discharged home with family.

## 2013-12-05 NOTE — Discharge Instructions (Signed)
Your vital signs, physical exam, and lab tests, and chest xray all appear normal. Try acid blocker medication, protonix, as prescribed.  You may also try maalox or mylanta as need for symptom relief. You may take ultram as need for pain - no driving for the next 6 hours or if taking ultram.  Follow up with your primary care doctor in the next couple days for recheck - call their office Monday to arrange that follow up appointment. Also discuss possible referral to cardiologist with your doctor then.  Return to ER right away if worse, trouble breathing, persistent or recurrent chest pain, other concern.    Chest Pain (Nonspecific) It is often hard to give a specific diagnosis for the cause of chest pain. There is always a chance that your pain could be related to something serious, such as a heart attack or a blood clot in the lungs. You need to follow up with your caregiver for further evaluation. CAUSES   Heartburn.  Pneumonia or bronchitis.  Anxiety or stress.  Inflammation around your heart (pericarditis) or lung (pleuritis or pleurisy).  A blood clot in the lung.  A collapsed lung (pneumothorax). It can develop suddenly on its own (spontaneous pneumothorax) or from injury (trauma) to the chest.  Shingles infection (herpes zoster virus). The chest wall is composed of bones, muscles, and cartilage. Any of these can be the source of the pain.  The bones can be bruised by injury.  The muscles or cartilage can be strained by coughing or overwork.  The cartilage can be affected by inflammation and become sore (costochondritis). DIAGNOSIS  Lab tests or other studies, such as X-rays, electrocardiography, stress testing, or cardiac imaging, may be needed to find the cause of your pain.  TREATMENT   Treatment depends on what may be causing your chest pain. Treatment may include:  Acid blockers for heartburn.  Anti-inflammatory medicine.  Pain medicine for inflammatory  conditions.  Antibiotics if an infection is present.  You may be advised to change lifestyle habits. This includes stopping smoking and avoiding alcohol, caffeine, and chocolate.  You may be advised to keep your head raised (elevated) when sleeping. This reduces the chance of acid going backward from your stomach into your esophagus.  Most of the time, nonspecific chest pain will improve within 2 to 3 days with rest and mild pain medicine. HOME CARE INSTRUCTIONS   If antibiotics were prescribed, take your antibiotics as directed. Finish them even if you start to feel better.  For the next few days, avoid physical activities that bring on chest pain. Continue physical activities as directed.  Do not smoke.  Avoid drinking alcohol.  Only take over-the-counter or prescription medicine for pain, discomfort, or fever as directed by your caregiver.  Follow your caregiver's suggestions for further testing if your chest pain does not go away.  Keep any follow-up appointments you made. If you do not go to an appointment, you could develop lasting (chronic) problems with pain. If there is any problem keeping an appointment, you must call to reschedule. SEEK MEDICAL CARE IF:   You think you are having problems from the medicine you are taking. Read your medicine instructions carefully.  Your chest pain does not go away, even after treatment.  You develop a rash with blisters on your chest. SEEK IMMEDIATE MEDICAL CARE IF:   You have increased chest pain or pain that spreads to your arm, neck, jaw, back, or abdomen.  You develop shortness of breath, an  increasing cough, or you are coughing up blood.  You have severe back or abdominal pain, feel nauseous, or vomit.  You develop severe weakness, fainting, or chills.  You have a fever. THIS IS AN EMERGENCY. Do not wait to see if the pain will go away. Get medical help at once. Call your local emergency services (911 in [place] Do not drive  yourself to the hospital. MAKE SURE YOU:   Understand these instructions.  Will watch your condition.  Will get help right away if you are not doing well or get worse. Document Released: 06/13/2005 Document Revised: 11/26/2011 Document Reviewed: 04/08/2008 [facility] CenterExitCare Patient Information 2014 [place], [place].    Chest Wall Pain Chest wall pain is pain in or around the bones and muscles of your chest. It may take up to 6 weeks to get better. It may take longer if you must stay physically active in your work and activities.  CAUSES  Chest wall pain may happen on its own. However, it may be caused by:  A viral illness like the flu.  Injury.  Coughing.  Exercise.  Arthritis.  Fibromyalgia.  Shingles. HOME CARE INSTRUCTIONS   Avoid overtiring physical activity. Try not to strain or perform activities that cause pain. This includes any activities using your chest or your abdominal and side muscles, especially if heavy weights are used.  Put ice on the sore area.  Put ice in a plastic bag.  Place a towel between your skin and the bag.  Leave the ice on for 15-20 minutes per hour while awake for the first 2 days.  Only take over-the-counter or prescription medicines for pain, discomfort, or fever as directed by your caregiver. SEEK IMMEDIATE MEDICAL CARE IF:   Your pain increases, or you are very uncomfortable.  You have a fever.  Your chest pain becomes worse.  You have new, unexplained symptoms.  You have nausea or vomiting.  You feel sweaty or lightheaded.  You have a cough with phlegm (sputum), or you cough up blood. MAKE SURE YOU:   Understand these instructions.  Will watch your condition.  Will get help right away if you are not doing well or get worse. Document Released: 09/03/2005 Document Revised: 11/26/2011 Document Reviewed: 04/30/2011 [facility]ExitCare Patient Information 2014 [place]ExitCare, [place].   Gastroesophageal Reflux Disease, Adult Gastroesophageal  reflux disease (GERD) happens when acid from your stomach flows up into the esophagus. When acid comes in contact with the esophagus, the acid causes soreness (inflammation) in the esophagus. Over time, GERD may create small holes (ulcers) in the lining of the esophagus. CAUSES   Increased body weight. This puts pressure on the stomach, making acid rise from the stomach into the esophagus.  Smoking. This increases acid production in the stomach.  Drinking alcohol. This causes decreased pressure in the lower esophageal sphincter (valve or ring of muscle between the esophagus and stomach), allowing acid from the stomach into the esophagus.  Late evening meals and a full stomach. This increases pressure and acid production in the stomach.  A malformed lower esophageal sphincter. Sometimes, no cause is found. SYMPTOMS   Burning pain in the lower part of the mid-chest behind the breastbone and in the mid-stomach area. This may occur twice a week or more often.  Trouble swallowing.  Sore throat.  Dry cough.  Asthma-like symptoms including chest tightness, shortness of breath, or wheezing. DIAGNOSIS  Your caregiver may be able to diagnose GERD based on your symptoms. In some cases, X-rays and other tests  may be done to check for complications or to check the condition of your stomach and esophagus. TREATMENT  Your caregiver may recommend over-the-counter or prescription medicines to help decrease acid production. Ask your caregiver before starting or adding any new medicines.  HOME CARE INSTRUCTIONS   Change the factors that you can control. Ask your caregiver for guidance concerning weight loss, quitting smoking, and alcohol consumption.  Avoid foods and drinks that make your symptoms worse, such as:  Caffeine or alcoholic drinks.  Chocolate.  Peppermint or mint flavorings.  Garlic and onions.  Spicy foods.  Citrus fruits, such as oranges, lemons, or limes.  Tomato-based foods  such as sauce, chili, salsa, and pizza.  Fried and fatty foods.  Avoid lying down for the 3 hours prior to your bedtime or prior to taking a nap.  Eat small, frequent meals instead of large meals.  Wear loose-fitting clothing. Do not wear anything tight around your waist that causes pressure on your stomach.  Raise the head of your bed 6 to 8 inches with wood blocks to help you sleep. Extra pillows will not help.  Only take over-the-counter or prescription medicines for pain, discomfort, or fever as directed by your caregiver.  Do not take aspirin, ibuprofen, or other nonsteroidal anti-inflammatory drugs (NSAIDs). SEEK IMMEDIATE MEDICAL CARE IF:   You have pain in your arms, neck, jaw, teeth, or back.  Your pain increases or changes in intensity or duration.  You develop nausea, vomiting, or sweating (diaphoresis).  You develop shortness of breath, or you faint.  Your vomit is green, yellow, black, or looks like coffee grounds or blood.  Your stool is red, bloody, or black. These symptoms could be signs of other problems, such as heart disease, gastric bleeding, or esophageal bleeding. MAKE SURE YOU:   Understand these instructions.  Will watch your condition.  Will get help right away if you are not doing well or get worse. Document Released: 06/13/2005 Document Revised: 11/26/2011 Document Reviewed: 03/23/2011 [facility] Patient Information 2014 [place], [place].
